# Patient Record
Sex: Female | Born: 2005 | Race: White | Hispanic: No | Marital: Single | Smoking: Never smoker
Health system: Southern US, Community
[De-identification: ages and names within clinical notes are randomized; demographics above are authoritative.]

## PROBLEM LIST (undated history)

## (undated) DIAGNOSIS — E079 Disorder of thyroid, unspecified: Secondary | ICD-10-CM

---
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
## 2023-11-29 ENCOUNTER — Ambulatory Visit: Payer: Self-pay

## 2023-11-30 ENCOUNTER — Ambulatory Visit (HOSPITAL_COMMUNITY): Payer: Self-pay

## 2023-12-01 ENCOUNTER — Emergency Department (HOSPITAL_COMMUNITY)
Admission: EM | Admit: 2023-12-01 | Discharge: 2023-12-01 | Disposition: A | Discharge status: Home / Self Care | Attending: Emergency Medicine | Admitting: Emergency Medicine

## 2023-12-01 ENCOUNTER — Encounter (HOSPITAL_COMMUNITY): Payer: Self-pay

## 2023-12-01 ENCOUNTER — Other Ambulatory Visit: Payer: Self-pay

## 2023-12-01 DIAGNOSIS — Z91010 Allergy to peanuts: Secondary | ICD-10-CM | POA: Diagnosis not present

## 2023-12-01 DIAGNOSIS — Z202 Contact with and (suspected) exposure to infections with a predominantly sexual mode of transmission: Secondary | ICD-10-CM | POA: Diagnosis not present

## 2023-12-01 DIAGNOSIS — N760 Acute vaginitis: Secondary | ICD-10-CM | POA: Insufficient documentation

## 2023-12-01 DIAGNOSIS — Z711 Person with feared health complaint in whom no diagnosis is made: Secondary | ICD-10-CM

## 2023-12-01 DIAGNOSIS — B9689 Other specified bacterial agents as the cause of diseases classified elsewhere: Secondary | ICD-10-CM | POA: Insufficient documentation

## 2023-12-01 LAB — WET PREP, GENITAL
Sperm: NONE SEEN
Trich, Wet Prep: NONE SEEN
WBC, Wet Prep HPF POC: <10 (ref <10)
Yeast Wet Prep HPF POC: NONE SEEN

## 2023-12-01 LAB — POC URINE PREG, ED: Preg Test, Ur: NEGATIVE

## 2023-12-01 MED ORDER — METRONIDAZOLE 500 MG PO TABS
500.0000 mg | ORAL_TABLET | Freq: Two times a day (BID) | ORAL | 0 refills | Status: AC
Start: 2023-12-01 — End: ?

## 2023-12-01 NOTE — Discharge Instructions (Addendum)
 You have tested positive for bacterial vaginosis today.  We have started you on antibiotics for this.  Please fill and take these as prescribed in their entirety.  Do not drink alcohol when you are taking this medication.  You were also swabbed for gonorrhea/chlamydia that is pending at discharge.  This takes a few days to resolve.  Please monitor your MyChart online for these test results and return for treatment if anything is positive.  Return if development of any new or worsening symptoms.

## 2023-12-01 NOTE — ED Triage Notes (Signed)
 Pt presents to ED from home requesting evaluation for STI. Pt states, I have had a new sexual partner and I just wanna make sure I'm good. Pt denies dysuria, vaginal pain, vaginal discharge, or foul odor. Pt reports having unprotected intercourse.

## 2023-12-01 NOTE — ED Provider Notes (Signed)
 [place] [facility] AT [facility] Provider Note   CSN: [id number] Arrival date & time: 12/01/23  1111     Patient presents with: Exposure to STD   Julia Ortega is a 18 y.o. female.   Patient with no pertinent past medical history presents today requesting STD check.  She reports recent unprotected intercourse with a new partner and therefore wants to be checked. Denies any symptoms or known exposures. Denies abdominal pain, vaginal discharge, dysuria.   The history is provided by the patient. No language interpreter was used.  Exposure to STD       Prior to Admission medications   Medication Sig Start Date End Date Taking? Authorizing Provider  doxycycline  (VIBRAMYCIN ) 100 MG capsule Take 1 capsule (100 mg total) by mouth 2 (two) times daily. 12/03/22   LampteyAleene KIDD, MD  levothyroxine  (SYNTHROID ) 50 MCG tablet Take 1 tablet (50 mcg total) by mouth daily. 07/29/22   Hazen Darryle BRAVO, FNP    Allergies: Peanut-containing drug products    Review of Systems  All other systems reviewed and are negative.   Updated Vital Signs BP (!) 152/89 (BP Location: Right Arm)   Pulse 72   Temp 98.1 F (36.7 C) (Oral)   Resp 16   Ht 5' 5 (1.651 m)   Wt 71.7 kg   SpO2 99%   BMI 26.29 kg/m   Physical Exam Vitals and nursing note reviewed.  Constitutional:      General: She is not in acute distress.    Appearance: Normal appearance. She is normal weight. She is not ill-appearing, toxic-appearing or diaphoretic.  HENT:     Head: Normocephalic and atraumatic.  Cardiovascular:     Rate and Rhythm: Normal rate.  Pulmonary:     Effort: Pulmonary effort is normal. No respiratory distress.  Abdominal:     General: Abdomen is flat.     Palpations: Abdomen is soft.     Tenderness: There is no abdominal tenderness.  Genitourinary:    Comments: Patient deferred pelvic exam Musculoskeletal:        General: Normal range of motion.     Cervical back:  Normal range of motion.  Skin:    General: Skin is warm and dry.  Neurological:     General: No focal deficit present.     Mental Status: She is alert.  Psychiatric:        Mood and Affect: Mood normal.        Behavior: Behavior normal.     (all labs ordered are listed, but only abnormal results are displayed) Labs Reviewed  WET PREP, GENITAL - Abnormal; Notable for the following components:      Result Value   Clue Cells Wet Prep HPF POC PRESENT (*)    All other components within normal limits  POC URINE PREG, ED  GC/CHLAMYDIA PROBE AMP ([place]) NOT AT [facility]    EKG: None  Radiology: No results found.   Procedures   Medications Ordered in the ED - No data to display                                  Medical Decision Making Amount and/or Complexity of Data Reviewed Labs: ordered.   Pt presents today requesting STD check. She is afebrile, non-toxic appearing, and in no acute distress with reassuring vital signs. Physical exam reveals abdomen soft and nontender, patient defers pelvic  exam given no symptoms, would prefer to self swab.  Pt understands that they have GC/Chlamydia cultures pending and that they will need to inform all sexual partners if results return positive. Wet prep did show clue cells consistent with BV, will treat for same with Flagyl .  Discussed prophylactic treatment for gonorrhea/chlamydia, she declines which is reasonable given no symptoms.  Given this, no concern for PID.  Pt has been advised to not drink alcohol while on Flagyl . Offered HIV and RPR which patient declined.  Patient to be discharged with instructions to follow up with OBGYN/PCP. Discussed importance of using protection when sexually active. Evaluation and diagnostic testing in the emergency department does not suggest an emergent condition requiring admission or immediate intervention beyond what has been performed at this time.  Plan for discharge with close PCP follow-up.  Patient is  understanding and amenable with plan, educated on red flag symptoms that would prompt immediate return.  Patient discharged in stable condition.  Final diagnoses:  Concern about STD in female without diagnosis  Bacterial vaginosis    ED Discharge Orders          Ordered    metroNIDAZOLE  (FLAGYL ) 500 MG tablet  2 times daily        12/01/23 1348          An After Visit Summary was printed and given to the patient.      Emmanuell Kantz A, PA-C 12/01/23 1350    Elnor Savant A, DO 12/02/23 [phone]

## 2023-12-02 LAB — GC/CHLAMYDIA PROBE AMP (~~LOC~~) NOT AT ARMC
Chlamydia: NEGATIVE
Comment: NEGATIVE
Comment: NORMAL
Neisseria Gonorrhea: NEGATIVE
